# Patient Record
Sex: Male | Born: 1975 | Race: Black or African American | Hispanic: No | Marital: Single | Smoking: Never smoker
Health system: Southern US, Community
[De-identification: ages and names within clinical notes are randomized; demographics above are authoritative.]

## PROBLEM LIST (undated history)

## (undated) HISTORY — PX: SHOULDER SURGERY: SHX246

---

## 2018-05-14 ENCOUNTER — Emergency Department: Payer: BLUE CROSS/BLUE SHIELD

## 2018-05-14 ENCOUNTER — Encounter: Payer: Self-pay | Admitting: Emergency Medicine

## 2018-05-14 ENCOUNTER — Emergency Department
Admission: EM | Admit: 2018-05-14 | Discharge: 2018-05-14 | Disposition: A | Discharge status: Home / Self Care | Payer: BLUE CROSS/BLUE SHIELD | Attending: Emergency Medicine | Admitting: Emergency Medicine

## 2018-05-14 ENCOUNTER — Other Ambulatory Visit: Payer: Self-pay

## 2018-05-14 DIAGNOSIS — S6992XA Unspecified injury of left wrist, hand and finger(s), initial encounter: Secondary | ICD-10-CM | POA: Diagnosis present

## 2018-05-14 DIAGNOSIS — Y939 Activity, unspecified: Secondary | ICD-10-CM | POA: Diagnosis not present

## 2018-05-14 DIAGNOSIS — Y929 Unspecified place or not applicable: Secondary | ICD-10-CM | POA: Insufficient documentation

## 2018-05-14 DIAGNOSIS — S63502A Unspecified sprain of left wrist, initial encounter: Secondary | ICD-10-CM | POA: Diagnosis not present

## 2018-05-14 DIAGNOSIS — Y999 Unspecified external cause status: Secondary | ICD-10-CM | POA: Diagnosis not present

## 2018-05-14 DIAGNOSIS — W231XXA Caught, crushed, jammed, or pinched between stationary objects, initial encounter: Secondary | ICD-10-CM | POA: Diagnosis not present

## 2018-05-14 NOTE — ED Provider Notes (Signed)
[facility] Emergency Department Provider Note  ____________________________________________   First MD Initiated Contact with Patient 05/14/18 [phone]     (approximate)  I have reviewed the triage vital signs and the nursing notes.   HISTORY  Chief Complaint Wrist Pain    HPI Andre Hammond is a 42 y.o. male with no contributory past medical history who presents for evaluation of left wrist pain.  He reports that he was lifting a speaker and trying to move it when he caught his wrist between the box and a table.  He had to twist his wrist to pull it out and felt acute onset of pain at that time.  He reports that the pain was mild but worse when he flexes or extends the wrist.  No swelling, no numbness or tingling.  He is able to move his fingers but hurts to do so.  No laceration or abrasion.  No other injuries.  History reviewed. No pertinent past medical history.  There are no active problems to display for this patient.   History reviewed. No pertinent surgical history.  Prior to Admission medications   Not on File    Allergies Amoxicillin and Penicillins  No family history on file.  Social History Social History   Tobacco Use  . Smoking status: Never Smoker  . Smokeless tobacco: Never Used  Substance Use Topics  . Alcohol use: Yes  . Drug use: Never    Review of Systems Constitutional: No fever/chills Cardiovascular: Denies chest pain. Respiratory: Denies shortness of breath. Gastrointestinal: No nausea, no vomiting.   Musculoskeletal: Pain in left wrist as described above Integumentary: Negative for lacerations or abrasions Neurological: Negative for headaches, focal weakness or numbness.   ____________________________________________   PHYSICAL EXAM:  VITAL SIGNS: ED Triage Vitals  Enc Vitals Group     BP 05/14/18 0109 109/75     Pulse Rate 05/14/18 0109 (!) 105     Resp 05/14/18 0109 18     Temp 05/14/18 0109 98.2 F  (36.8 C)     Temp Source 05/14/18 0109 Oral     SpO2 05/14/18 0109 100 %     Weight 05/14/18 0107 111.1 kg (245 lb)     Height 05/14/18 0107 1.803 m ( )     Head Circumference --      Peak Flow --      Pain Score 05/14/18 0106 8     Pain Loc --      Pain Edu? --      Excl. in GC? --     Constitutional: Alert and oriented. Well appearing and in no acute distress. Eyes: Conjunctivae are normal.  Head: Atraumatic. Cardiovascular: Good peripheral circulation.  Respiratory: Normal respiratory effort.  No retractions.  Musculoskeletal: No gross deformity of the left forearm or wrist.  Tenderness to  Palpation of the wrist mostly on the ulnar side.  Reproducible tenderness with flexion extension of the wrist.  No limitation of range of motion.  No snuffbox tenderness of the hand. Neurologic:  Normal speech and language. No gross focal neurologic deficits are appreciated.  Skin:  Skin is warm, dry and intact. No rash noted. Psychiatric: Mood and affect are normal. Speech and behavior are normal.  ____________________________________________   LABS (all labs ordered are listed, but only abnormal results are displayed)  Labs Reviewed - No data to display ____________________________________________  EKG  No indication for EKG ____________________________________________  [facility] LLC[facility]ally viewed and evaluated these images (plain  radiographs) as part of my medical decision making, as well as reviewing the written report by the radiologist.  ED MD interpretation: No fracture or dislocation  Official radiology report(s): Dg Wrist Complete Left  Result Date: 05/14/2018 CLINICAL DATA:  Caught wrist between box and table, with left wrist pain. Initial encounter. EXAM: LEFT WRIST - COMPLETE 3+ VIEW COMPARISON:  None. FINDINGS: There is no evidence of fracture or dislocation. The carpal rows are intact, and demonstrate normal alignment. The joint spaces are  preserved. No significant soft tissue abnormalities are seen. IMPRESSION: No evidence of fracture or dislocation. Electronically Signed   By: Roanna Raider M.D.   On: 05/14/2018 01:49    ____________________________________________   PROCEDURES  Critical Care performed: No   Procedure(s) performed:   Procedures   ____________________________________________   INITIAL IMPRESSION / ASSESSMENT AND PLAN / ED COURSE  As part of my medical decision making, I reviewed the following data within the electronic MEDICAL RECORD NUMBER Nursing notes reviewed and incorporated and Radiograph reviewed     Patient likely sprained his wrist when he was pulling it out from between the table and the speaker.  No osseous injury.  Neurovascular intact.  Talk to him about wrist sprain and gave my usual customary recommendations including RICE and removable Velcro wrist splint.  Advised outpatient follow-up with orthopedics in about a week if he is still having issues.  I gave my usual and customary return precautions.  Recommended over-the-counter analgesia.  He understands and agrees with the plan.     ____________________________________________  FINAL CLINICAL IMPRESSION(S) / ED DIAGNOSES  Final diagnoses:  Sprain of left wrist, initial encounter     MEDICATIONS GIVEN DURING THIS VISIT:  Medications - No data to display   ED Discharge Orders    None       Note:  This document was prepared using Dragon voice recognition software and may include unintentional dictation errors.    Loleta Rose, MD 05/14/18 [phone]

## 2018-05-14 NOTE — ED Triage Notes (Signed)
Pt arrives ambulatory to triage with c/o left wrist injury. Per pt, he was lifting a box and caught his wrist between the box and table. Pt is in NAD and no deformity is noted to wrist at this time.

## 2018-05-14 NOTE — ED Notes (Signed)
Pt reports a squeezing/crushing injury to left lateral wrist whilst carrying a heavy box through a doorway on Thursday, pt reports pain when closing or opening fist, pt has been using ice and taking IBU for pain and now concerned d/t pain continuing  CMS intact to distal extremities, area appears swollen, temp unremarkable

## 2018-05-14 NOTE — Discharge Instructions (Signed)
Please use the provided wrist splint as needed for comfort.  As we discussed, we recommend that you take ibuprofen 600 mg 3 times a day with meals for the next 5 days.  You may also take Tylenol 1000 mg 4 times a day.  The 2 of these medications should make her pain tolerable.  You can also continue using ice packs as needed for comfort.  Please read through the included information and follow-up with the medical provider(s) listed. °

## 2018-11-01 ENCOUNTER — Emergency Department
Admission: EM | Admit: 2018-11-01 | Discharge: 2018-11-01 | Disposition: A | Discharge status: Home / Self Care | Payer: No Typology Code available for payment source | Attending: Emergency Medicine | Admitting: Emergency Medicine

## 2018-11-01 ENCOUNTER — Other Ambulatory Visit: Payer: Self-pay

## 2018-11-01 ENCOUNTER — Emergency Department: Payer: No Typology Code available for payment source

## 2018-11-01 ENCOUNTER — Encounter: Payer: Self-pay | Admitting: Emergency Medicine

## 2018-11-01 DIAGNOSIS — M25531 Pain in right wrist: Secondary | ICD-10-CM | POA: Insufficient documentation

## 2018-11-01 DIAGNOSIS — G8929 Other chronic pain: Secondary | ICD-10-CM | POA: Diagnosis not present

## 2018-11-01 MED ORDER — PREDNISONE 10 MG PO TABS: ORAL_TABLET | ORAL | 0 refills | Status: DC
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
## 2018-11-01 NOTE — Discharge Instructions (Addendum)
You will need to call make an appointment with [facility] orthopedic department if not improving.  Wear wrist splint for support and protection.  Begin taking prednisone today as directed for the next 6 days.  You may use ice to your wrist as needed for discomfort.
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
## 2021-04-25 ENCOUNTER — Ambulatory Visit
Admission: EM | Admit: 2021-04-25 | Discharge: 2021-04-25 | Disposition: A | Discharge status: Home / Self Care | Payer: Self-pay | Attending: Emergency Medicine | Admitting: Emergency Medicine

## 2021-04-25 ENCOUNTER — Other Ambulatory Visit: Payer: Self-pay

## 2021-04-25 ENCOUNTER — Ambulatory Visit (INDEPENDENT_AMBULATORY_CARE_PROVIDER_SITE_OTHER): Payer: Self-pay

## 2021-04-25 ENCOUNTER — Encounter: Payer: Self-pay | Admitting: Emergency Medicine

## 2021-04-25 DIAGNOSIS — M25511 Pain in right shoulder: Secondary | ICD-10-CM

## 2021-04-25 DIAGNOSIS — S83422A Sprain of lateral collateral ligament of left knee, initial encounter: Secondary | ICD-10-CM

## 2021-04-25 DIAGNOSIS — S46811A Strain of other muscles, fascia and tendons at shoulder and upper arm level, right arm, initial encounter: Secondary | ICD-10-CM

## 2021-04-25 DIAGNOSIS — M25562 Pain in left knee: Secondary | ICD-10-CM

## 2021-04-25 MED ORDER — NAPROXEN 500 MG PO TABS: 500.0000 mg | ORAL_TABLET | Freq: Two times a day (BID) | ORAL | 0 refills | Status: DC

## 2021-04-25 MED ORDER — TIZANIDINE HCL 2 MG PO TABS: 2.0000 mg | ORAL_TABLET | Freq: Four times a day (QID) | ORAL | 0 refills | Status: DC | PRN

## 2021-04-25 NOTE — Discharge Instructions (Addendum)
Naprosyn twice daily with food to help with pain/swelling Ice and elevate knee May alternate ice and heat to shoulder Supplement with tizanidine at home/bedtime for discomfort and shoulder/back-this medicine may cause drowsiness, do not drive or work after taking  Follow-up with sports medicine if symptoms persisting

## 2021-04-25 NOTE — ED Provider Notes (Signed)
[facility]    CSN: [id number] Arrival date & time: 04/25/21  1531      History   Chief Complaint Chief Complaint  Patient presents with  . Knee Pain  . Shoulder Pain    HPI Andre Hammond is a 45 y.o. male presenting today for evaluation of left knee pain and right shoulder pain.  Symptoms began 4 days ago.  Symptoms began after he fell through a floor at work.  Reports increased pain with movement.  Using ice and TENS unit without relief.  Symptoms have a lot of pain to left knee as well as with right shoulder movements.  Denies history of problems with knee or shoulder.  HPI  History reviewed. No pertinent past medical history.  There are no problems to display for this patient.   History reviewed. No pertinent surgical history.     Home Medications    Prior to Admission medications   Medication Sig Start Date End Date Taking? Authorizing Provider  naproxen (NAPROSYN) 500 MG tablet Take 1 tablet (500 mg total) by mouth 2 (two) times daily. 04/25/21  Yes Elner Seifert C, PA-C  tiZANidine (ZANAFLEX) 2 MG tablet Take 1-2 tablets (2-4 mg total) by mouth every 6 (six) hours as needed for muscle spasms. 04/25/21  Yes Jameire Kouba, Junius Creamer, PA-C    Family History History reviewed. No pertinent family history.  Social History Social History   Tobacco Use  . Smoking status: Never Smoker  . Smokeless tobacco: Never Used  Substance Use Topics  . Alcohol use: Yes  . Drug use: Never     Allergies   Amoxicillin and Penicillins   Review of Systems Review of Systems  Constitutional: Negative for fatigue and fever.  Eyes: Negative for redness, itching and visual disturbance.  Respiratory: Negative for shortness of breath.   Cardiovascular: Negative for chest pain and leg swelling.  Gastrointestinal: Negative for nausea and vomiting.  Musculoskeletal: Positive for arthralgias and neck pain. Negative for myalgias.  Skin: Negative for color change, rash and  wound.  Neurological: Negative for dizziness, syncope, weakness, light-headedness and headaches.     Physical Exam Triage Vital Signs ED Triage Vitals  Enc Vitals Group     BP      Pulse      Resp      Temp      Temp src      SpO2      Weight      Height      Head Circumference      Peak Flow      Pain Score      Pain Loc      Pain Edu?      Excl. in GC?    No data found.  Updated Vital Signs BP 131/84 (BP Location: Left Arm)   Pulse 76   Temp 98.4 F (36.9 C) (Oral)   Resp 17   SpO2 98%   Visual Acuity Right Eye Distance:   Left Eye Distance:   Bilateral Distance:    Right Eye Near:   Left Eye Near:    Bilateral Near:     Physical Exam Vitals and nursing note reviewed.  Constitutional:      Appearance: He is well-developed.     Comments: No acute distress  HENT:     Head: Normocephalic and atraumatic.     Nose: Nose normal.  Eyes:     Conjunctiva/sclera: Conjunctivae normal.  Cardiovascular:     Rate and  Rhythm: Normal rate.  Pulmonary:     Effort: Pulmonary effort is normal. No respiratory distress.  Abdominal:     General: There is no distension.  Musculoskeletal:        General: Normal range of motion.     Cervical back: Neck supple.     Comments: Right shoulder: Nontender to palpation along length of clavicle, tenderness at [facility] joint and throughout trapezius area, nontender scapular spine or proximal humerus, full active range of motion of shoulder, strength 5/5 ankle bilaterally  Left knee: Mild swelling, no erythema noted discoloration, tenderness palpation to inferior pole of patella extending into lateral joint line, mild medial joint line tenderness, no infra or suprapatellar tenderness, relatively full active range of motion, no laxity appreciated with special testing, negative Lachman's, negative McMurray's  Spine: Nontender to palpation along cervical thoracic and lumbar spine midline, nontender throughout bilateral lumbar areas  Skin:     General: Skin is warm and dry.  Neurological:     Mental Status: He is alert and oriented to person, place, and time.      UC Treatments / Results  Labs (all labs ordered are listed, but only abnormal results are displayed) Labs Reviewed - No data to display  EKG   Radiology DG Shoulder Right  Result Date: 04/25/2021 CLINICAL DATA:  Acute right shoulder pain after injury 3 days ago. EXAM: RIGHT SHOULDER - 2+ VIEW COMPARISON:  None. FINDINGS: There is no evidence of fracture or dislocation. There is no evidence of arthropathy or other focal bone abnormality. Soft tissues are unremarkable. IMPRESSION: Negative. Electronically Signed   By: Lupita Raider M.D.   On: 04/25/2021 16:49   DG Knee Complete 4 Views Left  Result Date: 04/25/2021 CLINICAL DATA:  Left knee pain after injury 3 days ago. EXAM: LEFT KNEE - COMPLETE 4+ VIEW COMPARISON:  None. FINDINGS: No evidence of fracture, dislocation, or joint effusion. No evidence of arthropathy or other focal bone abnormality. Soft tissues are unremarkable. IMPRESSION: Negative. Electronically Signed   By: Lupita Raider M.D.   On: 04/25/2021 16:50    Procedures Procedures (including critical care time)  Medications Ordered in UC Medications - No data to display  Initial Impression / Assessment and Plan / UC Course  I have reviewed the triage vital signs and the nursing notes.  Pertinent labs & imaging results that were available during my care of the patient were reviewed by me and considered in my medical decision making (see chart for details).     Left knee sprain-imaging negative, suspect likely sprain, lower suspicion of underlying soft tissue injury, but discussed cannot rule out with x-ray.  Placing an hinged knee sleeve, recommending anti-inflammatories ice elevation and rest follow-up with sports medicine if not improving  Right shoulder pain/trapezius strain- imaging negative, treated with anti-inflammatories muscle  relaxers, alternate ice and heat and gentle stretching  Discussed strict return precautions. Patient verbalized understanding and is agreeable with plan.  Final Clinical Impressions(s) / UC Diagnoses   Final diagnoses:  Sprain of lateral collateral ligament of left knee, initial encounter  Strain of right trapezius muscle, initial encounter     Discharge Instructions     Naprosyn twice daily with food to help with pain/swelling Ice and elevate knee May alternate ice and heat to shoulder Supplement with tizanidine at home/bedtime for discomfort and shoulder/back-this medicine may cause drowsiness, do not drive or work after taking  Follow-up with sports medicine if symptoms persisting    ED Prescriptions  Medication Sig Dispense Auth. Provider   naproxen (NAPROSYN) 500 MG tablet Take 1 tablet (500 mg total) by mouth 2 (two) times daily. 30 tablet Guerline Happ C, PA-C   tiZANidine (ZANAFLEX) 2 MG tablet Take 1-2 tablets (2-4 mg total) by mouth every 6 (six) hours as needed for muscle spasms. 30 tablet Theodoro Koval, [place] C, PA-C     PDMP not reviewed this encounter.   Lew Dawes, PA-C 04/25/21 1711

## 2021-04-25 NOTE — ED Triage Notes (Signed)
Patient c/o LFT knee pain and RT shoulder pain x 4 days.   Patient endorses onset of symptoms began " after falling through a floor at work".   Patient endorses increased pain with movement of arm and knee.   Patient ambulates with a steady gate.   Patient endorses swelling to LFT knee. Patient endorses tenderness upon palpation.   Patient has used ice and tens unit with no relief of symptoms.

## 2021-10-26 ENCOUNTER — Other Ambulatory Visit: Payer: Self-pay

## 2021-10-26 ENCOUNTER — Emergency Department (HOSPITAL_COMMUNITY): Payer: No Typology Code available for payment source

## 2021-10-26 ENCOUNTER — Emergency Department (HOSPITAL_COMMUNITY)
Admission: EM | Admit: 2021-10-26 | Discharge: 2021-10-26 | Disposition: A | Discharge status: Home / Self Care | Payer: No Typology Code available for payment source | Attending: Emergency Medicine | Admitting: Emergency Medicine

## 2021-10-26 DIAGNOSIS — S3992XA Unspecified injury of lower back, initial encounter: Secondary | ICD-10-CM | POA: Diagnosis not present

## 2021-10-26 DIAGNOSIS — S299XXA Unspecified injury of thorax, initial encounter: Secondary | ICD-10-CM | POA: Diagnosis not present

## 2021-10-26 DIAGNOSIS — Y92410 Unspecified street and highway as the place of occurrence of the external cause: Secondary | ICD-10-CM | POA: Insufficient documentation

## 2021-10-26 DIAGNOSIS — S8992XA Unspecified injury of left lower leg, initial encounter: Secondary | ICD-10-CM | POA: Diagnosis not present

## 2021-10-26 MED ORDER — HYDROCODONE-ACETAMINOPHEN 5-325 MG PO TABS: 1.0000 | ORAL_TABLET | Freq: Four times a day (QID) | ORAL | 0 refills | Status: DC | PRN
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
## 2021-10-26 NOTE — Discharge Instructions (Signed)
He can take the hydrocodone for pain if Tylenol and Motrin do not take care of the discomfort.  Follow-up with Dr. Dallas Schimke later this week for recheck

## 2021-10-26 NOTE — ED Notes (Signed)
Pt denies LOC 
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
## 2023-01-06 ENCOUNTER — Encounter (HOSPITAL_COMMUNITY): Payer: Self-pay | Admitting: *Deleted

## 2023-01-06 ENCOUNTER — Emergency Department (HOSPITAL_COMMUNITY)
Admission: EM | Admit: 2023-01-06 | Discharge: 2023-01-06 | Disposition: A | Discharge status: Home / Self Care | Payer: Medicaid Other | Attending: Emergency Medicine | Admitting: Emergency Medicine

## 2023-01-06 ENCOUNTER — Other Ambulatory Visit: Payer: Self-pay

## 2023-01-06 DIAGNOSIS — J101 Influenza due to other identified influenza virus with other respiratory manifestations: Secondary | ICD-10-CM | POA: Insufficient documentation

## 2023-01-06 DIAGNOSIS — R059 Cough, unspecified: Secondary | ICD-10-CM | POA: Diagnosis present

## 2023-01-06 DIAGNOSIS — Z1152 Encounter for screening for COVID-19: Secondary | ICD-10-CM | POA: Insufficient documentation

## 2023-01-06 LAB — RESP PANEL BY RT-PCR (RSV, FLU A&B, COVID)  RVPGX2
Influenza A by PCR: POSITIVE — AB
Influenza B by PCR: NEGATIVE
Resp Syncytial Virus by PCR: NEGATIVE
SARS Coronavirus 2 by RT PCR: NEGATIVE

## 2023-01-06 MED ORDER — KETOROLAC TROMETHAMINE 30 MG/ML IJ SOLN
30.0000 mg | Freq: Once | INTRAMUSCULAR | Status: AC
  Administered 2023-01-06: 30 mg via INTRAMUSCULAR
  Filled 2023-01-06: qty 1

## 2023-01-06 NOTE — ED Notes (Signed)
See triage notes. Nad. Mm wet. Able to eat and drink
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
## 2023-09-26 ENCOUNTER — Other Ambulatory Visit: Payer: Self-pay

## 2023-09-26 ENCOUNTER — Encounter (HOSPITAL_COMMUNITY): Payer: Self-pay | Admitting: Emergency Medicine

## 2023-09-26 ENCOUNTER — Emergency Department (HOSPITAL_COMMUNITY): Payer: Medicaid Other

## 2023-09-26 ENCOUNTER — Emergency Department (HOSPITAL_COMMUNITY)
Admission: EM | Admit: 2023-09-26 | Discharge: 2023-09-26 | Disposition: A | Discharge status: Home / Self Care | Payer: Medicaid Other | Attending: Emergency Medicine | Admitting: Emergency Medicine

## 2023-09-26 DIAGNOSIS — W01198A Fall on same level from slipping, tripping and stumbling with subsequent striking against other object, initial encounter: Secondary | ICD-10-CM | POA: Diagnosis not present

## 2023-09-26 DIAGNOSIS — S0990XA Unspecified injury of head, initial encounter: Secondary | ICD-10-CM | POA: Diagnosis present

## 2023-09-26 DIAGNOSIS — S0081XA Abrasion of other part of head, initial encounter: Secondary | ICD-10-CM | POA: Insufficient documentation

## 2023-09-26 DIAGNOSIS — S060XAA Concussion with loss of consciousness status unknown, initial encounter: Secondary | ICD-10-CM | POA: Insufficient documentation

## 2023-09-26 DIAGNOSIS — S46912A Strain of unspecified muscle, fascia and tendon at shoulder and upper arm level, left arm, initial encounter: Secondary | ICD-10-CM | POA: Diagnosis not present

## 2023-09-26 DIAGNOSIS — W19XXXA Unspecified fall, initial encounter: Secondary | ICD-10-CM

## 2023-09-26 DIAGNOSIS — S8391XA Sprain of unspecified site of right knee, initial encounter: Secondary | ICD-10-CM | POA: Diagnosis not present

## 2023-09-26 DIAGNOSIS — Z23 Encounter for immunization: Secondary | ICD-10-CM | POA: Insufficient documentation

## 2023-09-26 DIAGNOSIS — S8390XA Sprain of unspecified site of unspecified knee, initial encounter: Secondary | ICD-10-CM

## 2023-09-26 DIAGNOSIS — T148XXA Other injury of unspecified body region, initial encounter: Secondary | ICD-10-CM

## 2023-09-26 DIAGNOSIS — S8392XA Sprain of unspecified site of left knee, initial encounter: Secondary | ICD-10-CM | POA: Diagnosis not present

## 2023-09-26 LAB — CBC
HCT: 37.9 % — ABNORMAL LOW (ref 39.0–52.0)
Hemoglobin: 12.5 g/dL — ABNORMAL LOW (ref 13.0–17.0)
MCH: 32.6 pg (ref 26.0–34.0)
MCHC: 33 g/dL (ref 30.0–36.0)
MCV: 99 fL (ref 80.0–100.0)
Platelets: 241 10*3/uL (ref 150–400)
RBC: 3.83 MIL/uL — ABNORMAL LOW (ref 4.22–5.81)
RDW: 11.9 % (ref 11.5–15.5)
WBC: 8.3 10*3/uL (ref 4.0–10.5)
nRBC: 0 % (ref 0.0–0.2)

## 2023-09-26 LAB — BASIC METABOLIC PANEL
Anion gap: 9 (ref 5–15)
BUN: 11 mg/dL (ref 6–20)
CO2: 27 mmol/L (ref 22–32)
Calcium: 8.8 mg/dL — ABNORMAL LOW (ref 8.9–10.3)
Chloride: 99 mmol/L (ref 98–111)
Creatinine, Ser: 0.89 mg/dL (ref 0.61–1.24)
GFR, Estimated: >60 mL/min (ref >60)
Glucose, Bld: 92 mg/dL (ref 70–99)
Potassium: 4 mmol/L (ref 3.5–5.1)
Sodium: 135 mmol/L (ref 135–145)

## 2023-09-26 LAB — CBG MONITORING, ED: Glucose-Capillary: 94 mg/dL (ref 70–99)

## 2023-09-26 LAB — TROPONIN I (HIGH SENSITIVITY)
Troponin I (High Sensitivity): 3 ng/L (ref <18)
Troponin I (High Sensitivity): 3 ng/L (ref <18)

## 2023-09-26 MED ORDER — HYDROCODONE-ACETAMINOPHEN 5-325 MG PO TABS
1.0000 | ORAL_TABLET | Freq: Once | ORAL | Status: AC
  Administered 2023-09-26: 1 via ORAL
  Filled 2023-09-26: qty 1

## 2023-09-26 MED ORDER — BACITRACIN ZINC 500 UNIT/GM EX OINT
TOPICAL_OINTMENT | Freq: Two times a day (BID) | CUTANEOUS | Status: DC
  Administered 2023-09-26: 1 via TOPICAL
  Filled 2023-09-26 (×2): qty 1.8

## 2023-09-26 MED ORDER — KETOROLAC TROMETHAMINE 60 MG/2ML IM SOLN
30.0000 mg | Freq: Once | INTRAMUSCULAR | Status: AC
  Administered 2023-09-26: 30 mg via INTRAMUSCULAR
  Filled 2023-09-26: qty 2

## 2023-09-26 MED ORDER — KETOROLAC TROMETHAMINE 15 MG/ML IJ SOLN: 15.0000 mg | Freq: Once | INTRAMUSCULAR | Status: DC

## 2023-09-26 MED ORDER — TETANUS-DIPHTH-ACELL PERTUSSIS 5-2.5-18.5 LF-MCG/0.5 IM SUSY
0.5000 mL | PREFILLED_SYRINGE | Freq: Once | INTRAMUSCULAR | Status: AC
  Administered 2023-09-26: 0.5 mL via INTRAMUSCULAR
  Filled 2023-09-26: qty 0.5

## 2023-09-26 NOTE — Discharge Instructions (Addendum)
Imaging studies today did not show any major injuries.  You may have some soft tissue injuries to your knee.  You should call the telephone number below to set up a follow-up appointment with the orthopedic doctor for reassessment.  In the meantime, take ibuprofen and Tylenol as needed for pain and soreness.  You can put weight on your right leg within the tolerance of your pain.  Utilize crutches if you do need to offload weight from this leg.
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
## 2023-09-26 NOTE — ED Notes (Signed)
Bacitracin applied to wounds above left eyebrow, on left cheek, and below left ear.
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
## 2024-01-04 ENCOUNTER — Other Ambulatory Visit (HOSPITAL_COMMUNITY): Payer: Self-pay | Admitting: Otolaryngology

## 2024-01-04 DIAGNOSIS — M25561 Pain in right knee: Secondary | ICD-10-CM

## 2024-01-12 ENCOUNTER — Ambulatory Visit (HOSPITAL_COMMUNITY)
Admission: RE | Admit: 2024-01-12 | Discharge: 2024-01-12 | Disposition: A | Discharge status: Home / Self Care | Payer: Medicaid Other | Source: Ambulatory Visit | Attending: Otolaryngology | Admitting: Otolaryngology

## 2024-01-12 DIAGNOSIS — M25561 Pain in right knee: Secondary | ICD-10-CM

## 2024-07-06 ENCOUNTER — Other Ambulatory Visit (HOSPITAL_COMMUNITY): Payer: Self-pay | Admitting: Otolaryngology

## 2024-07-06 DIAGNOSIS — M25561 Pain in right knee: Secondary | ICD-10-CM

## 2024-07-09 ENCOUNTER — Ambulatory Visit (HOSPITAL_COMMUNITY)
Admission: RE | Admit: 2024-07-09 | Discharge: 2024-07-09 | Disposition: A | Discharge status: Home / Self Care | Source: Ambulatory Visit | Attending: Otolaryngology | Admitting: Otolaryngology

## 2024-07-09 DIAGNOSIS — M25561 Pain in right knee: Secondary | ICD-10-CM | POA: Insufficient documentation
